# Patient Record
Sex: Female | Born: 1995 | Race: Black or African American | Hispanic: No | Marital: Single
Health system: Southern US, Community
[De-identification: ages and names within clinical notes are randomized; demographics above are authoritative.]

---

## 2018-03-02 ENCOUNTER — Other Ambulatory Visit: Payer: Self-pay | Admitting: Family

## 2018-03-02 DIAGNOSIS — N644 Mastodynia: Secondary | ICD-10-CM

## 2018-03-11 ENCOUNTER — Ambulatory Visit
Admission: RE | Admit: 2018-03-11 | Discharge: 2018-03-11 | Disposition: A | Discharge status: Home / Self Care | Payer: Managed Care, Other (non HMO) | Source: Ambulatory Visit | Attending: Family | Admitting: Family

## 2018-03-11 DIAGNOSIS — N644 Mastodynia: Secondary | ICD-10-CM

## 2019-08-05 ENCOUNTER — Ambulatory Visit: Payer: Self-pay | Attending: Internal Medicine

## 2019-08-05 DIAGNOSIS — Z23 Encounter for immunization: Secondary | ICD-10-CM

## 2019-08-05 NOTE — Progress Notes (Signed)
   Cov[facility] Vaccination [facility]  Name:  Merrisa Skorupski    MRN: [id number] DOB: Nov 22, 1995  08/05/2019  Ms. Tena was observed post Covid-19 immunization for 15 minutes without incident. She was provided with Vaccine Information Sheet and instruction to access the V-Safe system.   Ms. Borello was instructed to call 911 with any severe reactions post vaccine: Marland Kitchen Difficulty breathing  . Swelling of face and throat  . A fast heartbeat  . A bad rash all over body  . Dizziness and weakness   Immunizations Administered    Name Date Dose VIS Date Route   Moderna COVID-19 Vaccine 08/05/2019 11:18 AM 0.5 mL 03/30/2019 Intramuscular   Manufacturer: Moderna   Lot: [id number]   NDC: 59935-701-77

## 2019-09-07 ENCOUNTER — Ambulatory Visit: Payer: Self-pay | Attending: Family

## 2019-09-07 DIAGNOSIS — Z23 Encounter for immunization: Secondary | ICD-10-CM

## 2019-09-07 NOTE — Progress Notes (Signed)
   Covid-19 Vaccination Clinic  Name:  Megumi Treaster    MRN: [id number] DOB: 1996/04/13  09/07/2019  Ms. Kirsh was observed post Covid-19 immunization for 15 minutes without incident. She was provided with Vaccine Information Sheet and instruction to access the V-Safe system.   Ms. Doetsch was instructed to call 911 with any severe reactions post vaccine: Marland Kitchen Difficulty breathing  . Swelling of face and throat  . A fast heartbeat  . A bad rash all over body  . Dizziness and weakness   Immunizations Administered    Name Date Dose VIS Date Route   Moderna COVID-19 Vaccine 09/07/2019 10:57 AM 0.5 mL 03/2019 Intramuscular   Manufacturer: Moderna   Lot: [id number]   NDC: 74944-967-59

## 2020-01-07 IMAGING — US ULTRASOUND LEFT BREAST LIMITED
1 series · 8 of 8 positions shown · non-contrast
Comparison: None

CLINICAL DATA: 22-year-old female with focal pain in the UPPER
OUTER LEFT breast which has now resolved.

EXAM:
ULTRASOUND OF THE LEFT BREAST

[Series 1: ultrasound left breast limited · 0.06mm/px · 8 of 8 slices shown]
[im 1/8]
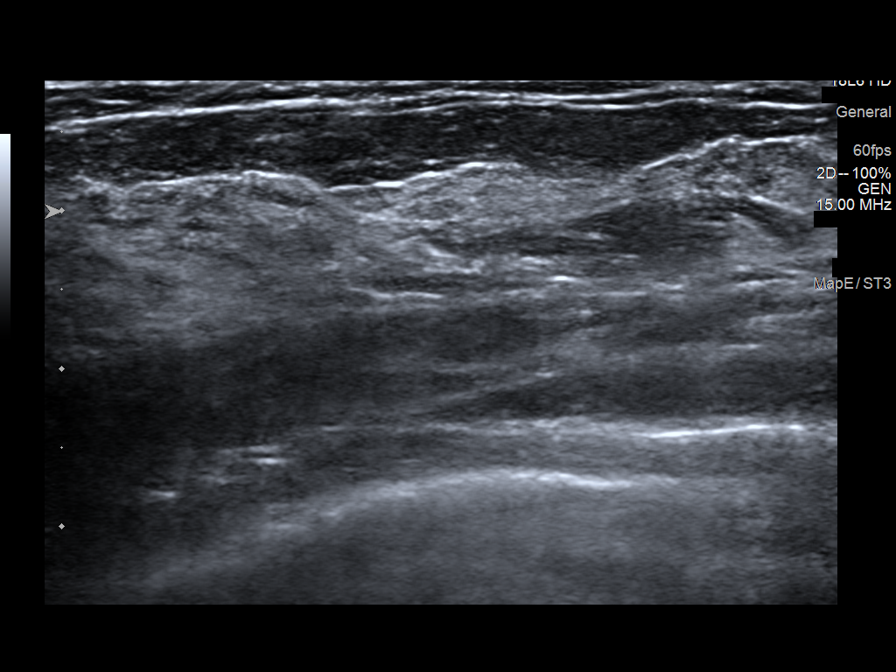
[im 2/8]
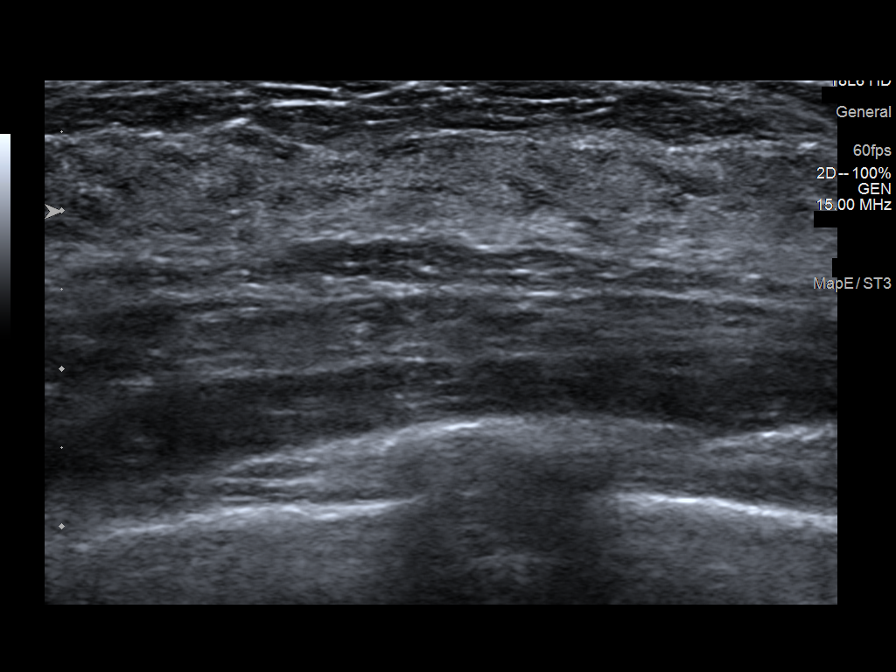
[im 3/8]
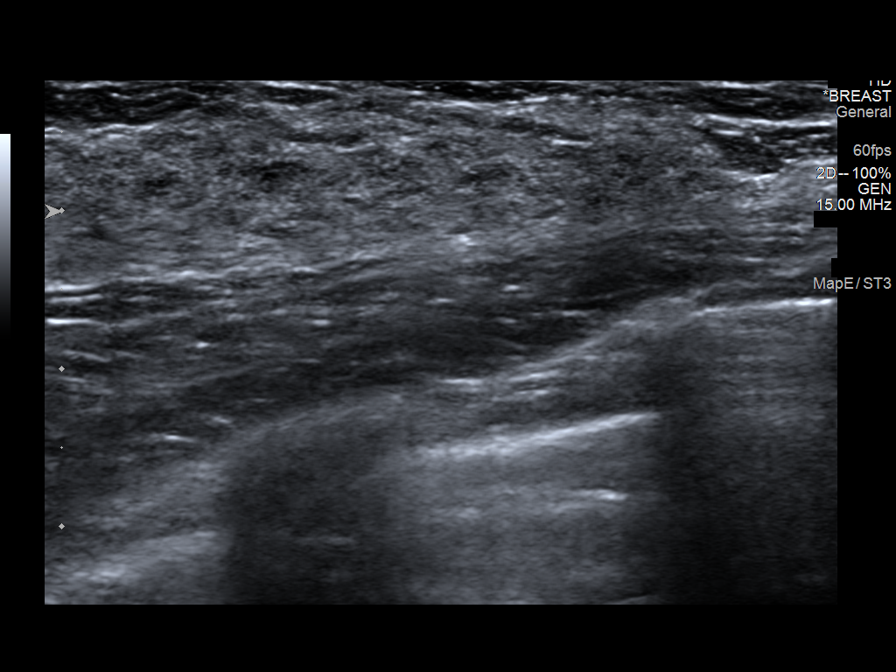
[im 4/8]
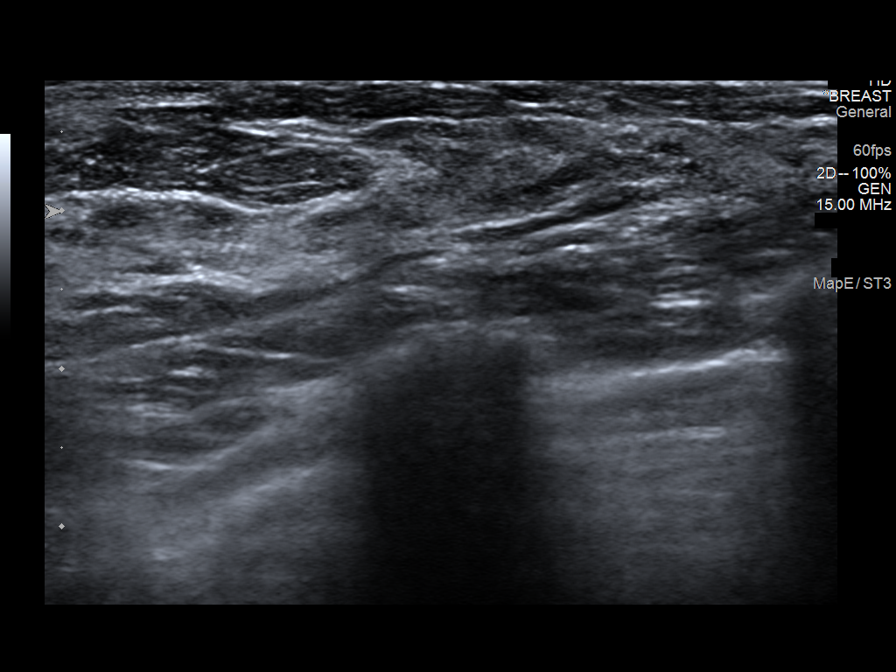
[im 5/8]
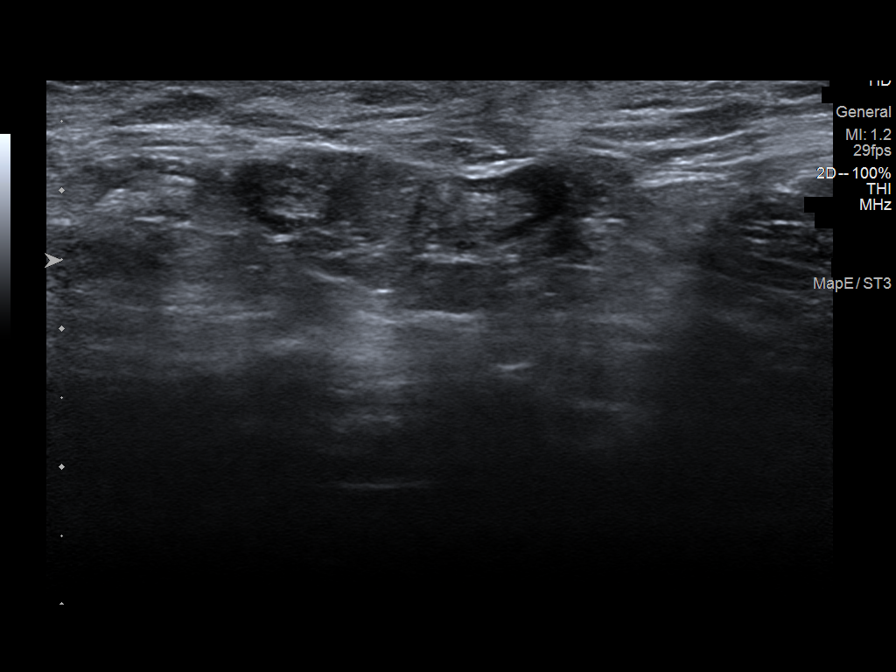
[im 6/8]
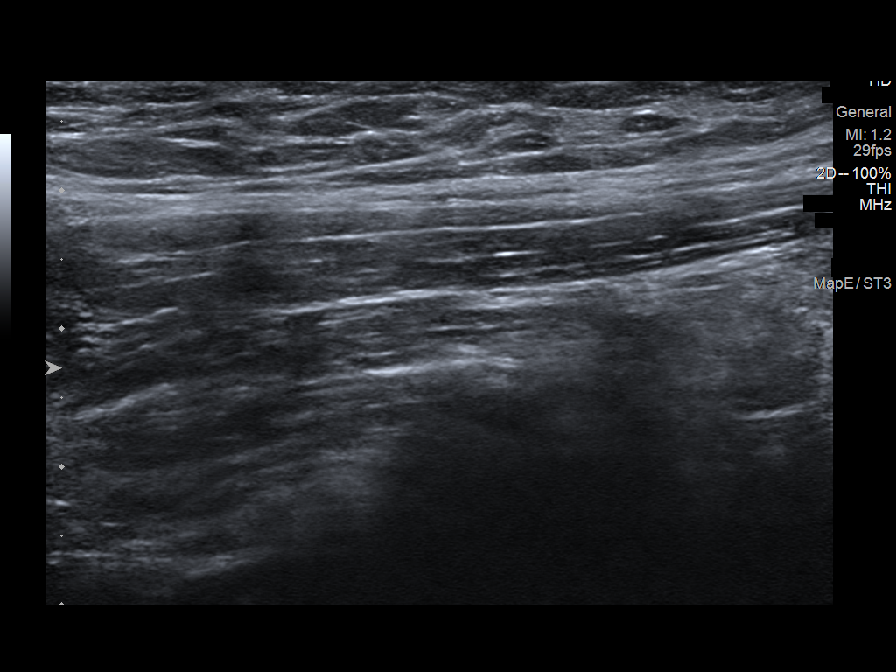
[im 7/8]
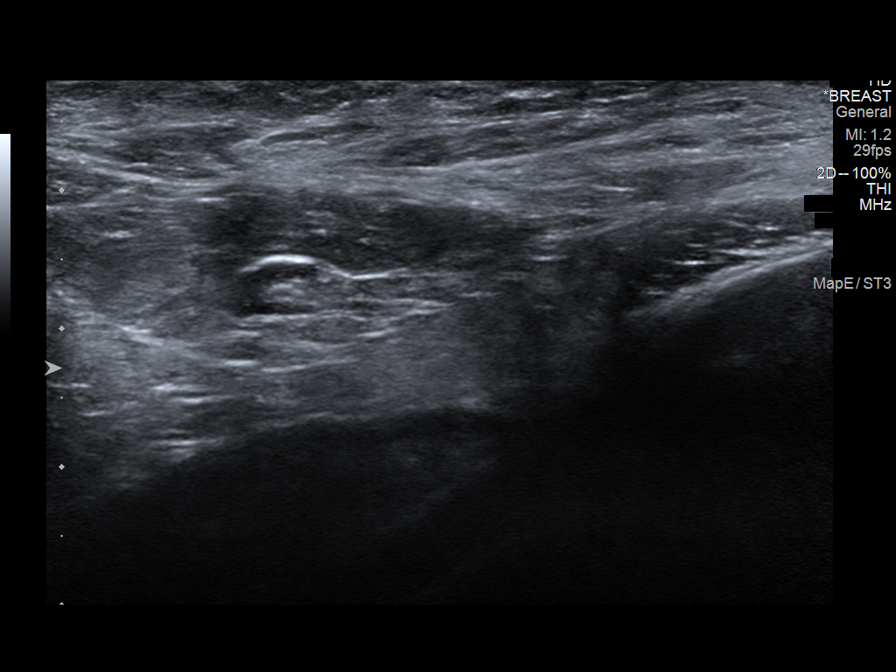
[im 8/8]
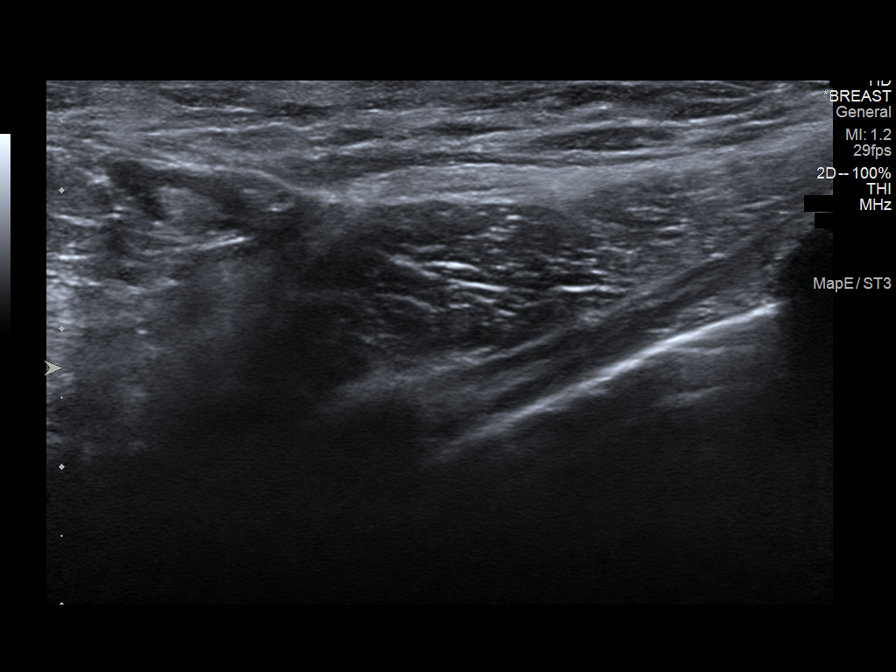

[8 of 8 positions shown; findings below may reference images not displayed]

FINDINGS: Targeted ultrasound is performed, showing no abnormalities within
the UPPER-OUTER LEFT breast, including solid or cystic mass,
distortion or abnormal shadowing. Normal dense fibroglandular tissue
within the UPPER OUTER LEFT breast identified.
IMPRESSION: No sonographic abnormalities within the UPPER-OUTER LEFT breast.

RECOMMENDATION:
Bilateral screening mammogram at age 40

I have discussed the findings, causes of breast pain and
recommendations with the patient. Results were also provided in
writing at the conclusion of the visit. If applicable, a reminder
letter will be sent to the patient regarding the next appointment.

BI-RADS CATEGORY  1: Negative.

## 2020-06-12 ENCOUNTER — Other Ambulatory Visit (HOSPITAL_COMMUNITY): Payer: Self-pay | Admitting: Nurse Practitioner

## 2020-08-30 ENCOUNTER — Other Ambulatory Visit (HOSPITAL_COMMUNITY): Payer: Self-pay

## 2020-08-30 MED FILL — Medroxyprogesterone Acetate IM Susp 150 MG/ML: INTRAMUSCULAR | 90 days supply | Qty: 1 | Fill #0 | Status: AC
